# Patient Record
Sex: Female | Born: 2000
Health system: Southern US, Community
[De-identification: ages and names within clinical notes are randomized; demographics above are authoritative.]

## PROBLEM LIST (undated history)

## (undated) DIAGNOSIS — D352 Benign neoplasm of pituitary gland: Secondary | ICD-10-CM

---
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
## 2018-05-27 DIAGNOSIS — J3089 Other allergic rhinitis: Secondary | ICD-10-CM | POA: Diagnosis not present

## 2018-05-27 DIAGNOSIS — L509 Urticaria, unspecified: Secondary | ICD-10-CM | POA: Diagnosis not present

## 2018-05-27 DIAGNOSIS — K219 Gastro-esophageal reflux disease without esophagitis: Secondary | ICD-10-CM | POA: Diagnosis not present

## 2019-08-14 DIAGNOSIS — Z79899 Other long term (current) drug therapy: Secondary | ICD-10-CM | POA: Diagnosis not present

## 2019-08-14 DIAGNOSIS — F902 Attention-deficit hyperactivity disorder, combined type: Secondary | ICD-10-CM | POA: Diagnosis not present

## 2019-08-14 DIAGNOSIS — F419 Anxiety disorder, unspecified: Secondary | ICD-10-CM | POA: Diagnosis not present

## 2019-09-20 ENCOUNTER — Other Ambulatory Visit (HOSPITAL_COMMUNITY)
Admission: RE | Admit: 2019-09-20 | Discharge: 2019-09-20 | Disposition: A | Discharge status: Home / Self Care | Payer: BC Managed Care – PPO | Source: Ambulatory Visit | Attending: Family Medicine | Admitting: Family Medicine

## 2019-09-20 ENCOUNTER — Other Ambulatory Visit: Payer: Self-pay | Admitting: Family Medicine

## 2019-09-20 DIAGNOSIS — Z23 Encounter for immunization: Secondary | ICD-10-CM | POA: Diagnosis not present

## 2019-09-20 DIAGNOSIS — N926 Irregular menstruation, unspecified: Secondary | ICD-10-CM | POA: Diagnosis not present

## 2019-09-20 DIAGNOSIS — R5383 Other fatigue: Secondary | ICD-10-CM | POA: Diagnosis not present

## 2019-09-20 DIAGNOSIS — Z124 Encounter for screening for malignant neoplasm of cervix: Secondary | ICD-10-CM | POA: Diagnosis not present

## 2019-09-20 DIAGNOSIS — Z0000 Encounter for general adult medical examination without abnormal findings: Secondary | ICD-10-CM | POA: Diagnosis not present

## 2019-09-20 DIAGNOSIS — K219 Gastro-esophageal reflux disease without esophagitis: Secondary | ICD-10-CM | POA: Diagnosis not present

## 2019-09-20 DIAGNOSIS — Z13220 Encounter for screening for lipoid disorders: Secondary | ICD-10-CM | POA: Diagnosis not present

## 2019-09-20 DIAGNOSIS — Z79899 Other long term (current) drug therapy: Secondary | ICD-10-CM | POA: Diagnosis not present

## 2019-09-22 LAB — CYTOLOGY - PAP
Adequacy: ABSENT
Diagnosis: NEGATIVE

## 2019-11-09 DIAGNOSIS — F902 Attention-deficit hyperactivity disorder, combined type: Secondary | ICD-10-CM | POA: Diagnosis not present

## 2019-11-09 DIAGNOSIS — Z79899 Other long term (current) drug therapy: Secondary | ICD-10-CM | POA: Diagnosis not present

## 2019-11-09 DIAGNOSIS — F419 Anxiety disorder, unspecified: Secondary | ICD-10-CM | POA: Diagnosis not present

## 2019-12-01 DIAGNOSIS — Z23 Encounter for immunization: Secondary | ICD-10-CM | POA: Diagnosis not present

## 2020-02-07 DIAGNOSIS — F902 Attention-deficit hyperactivity disorder, combined type: Secondary | ICD-10-CM | POA: Diagnosis not present

## 2020-02-07 DIAGNOSIS — Z79899 Other long term (current) drug therapy: Secondary | ICD-10-CM | POA: Diagnosis not present

## 2020-02-12 ENCOUNTER — Other Ambulatory Visit: Payer: Self-pay | Admitting: Internal Medicine

## 2020-02-12 DIAGNOSIS — Z842 Family history of other diseases of the genitourinary system: Secondary | ICD-10-CM | POA: Diagnosis not present

## 2020-02-12 DIAGNOSIS — N946 Dysmenorrhea, unspecified: Secondary | ICD-10-CM

## 2020-02-12 DIAGNOSIS — N920 Excessive and frequent menstruation with regular cycle: Secondary | ICD-10-CM | POA: Diagnosis not present

## 2020-02-12 DIAGNOSIS — E349 Endocrine disorder, unspecified: Secondary | ICD-10-CM | POA: Diagnosis not present

## 2020-02-13 DIAGNOSIS — N920 Excessive and frequent menstruation with regular cycle: Secondary | ICD-10-CM | POA: Diagnosis not present

## 2020-02-13 DIAGNOSIS — E349 Endocrine disorder, unspecified: Secondary | ICD-10-CM | POA: Diagnosis not present

## 2020-02-13 DIAGNOSIS — Z842 Family history of other diseases of the genitourinary system: Secondary | ICD-10-CM | POA: Diagnosis not present

## 2020-03-01 ENCOUNTER — Ambulatory Visit
Admission: RE | Admit: 2020-03-01 | Discharge: 2020-03-01 | Disposition: A | Discharge status: Home / Self Care | Payer: BC Managed Care – PPO | Source: Ambulatory Visit | Attending: Internal Medicine | Admitting: Internal Medicine

## 2020-03-01 DIAGNOSIS — N946 Dysmenorrhea, unspecified: Secondary | ICD-10-CM

## 2020-03-01 DIAGNOSIS — N939 Abnormal uterine and vaginal bleeding, unspecified: Secondary | ICD-10-CM | POA: Diagnosis not present

## 2020-03-06 DIAGNOSIS — N920 Excessive and frequent menstruation with regular cycle: Secondary | ICD-10-CM | POA: Diagnosis not present

## 2020-03-06 DIAGNOSIS — Z842 Family history of other diseases of the genitourinary system: Secondary | ICD-10-CM | POA: Diagnosis not present

## 2020-03-06 DIAGNOSIS — E221 Hyperprolactinemia: Secondary | ICD-10-CM | POA: Diagnosis not present

## 2020-03-13 DIAGNOSIS — Z1159 Encounter for screening for other viral diseases: Secondary | ICD-10-CM | POA: Diagnosis not present

## 2020-03-15 ENCOUNTER — Other Ambulatory Visit: Payer: Self-pay | Admitting: Internal Medicine

## 2020-03-15 DIAGNOSIS — E221 Hyperprolactinemia: Secondary | ICD-10-CM

## 2020-04-09 DIAGNOSIS — E221 Hyperprolactinemia: Secondary | ICD-10-CM | POA: Diagnosis not present

## 2020-04-09 DIAGNOSIS — Z842 Family history of other diseases of the genitourinary system: Secondary | ICD-10-CM | POA: Diagnosis not present

## 2020-04-09 DIAGNOSIS — N920 Excessive and frequent menstruation with regular cycle: Secondary | ICD-10-CM | POA: Diagnosis not present

## 2020-04-10 ENCOUNTER — Ambulatory Visit
Admission: RE | Admit: 2020-04-10 | Discharge: 2020-04-10 | Disposition: A | Discharge status: Home / Self Care | Payer: BC Managed Care – PPO | Source: Ambulatory Visit | Attending: Internal Medicine | Admitting: Internal Medicine

## 2020-04-10 ENCOUNTER — Other Ambulatory Visit: Payer: Self-pay

## 2020-04-10 DIAGNOSIS — E221 Hyperprolactinemia: Secondary | ICD-10-CM

## 2020-04-10 DIAGNOSIS — E237 Disorder of pituitary gland, unspecified: Secondary | ICD-10-CM | POA: Diagnosis not present

## 2020-04-10 MED ORDER — GADOBENATE DIMEGLUMINE 529 MG/ML IV SOLN
8.0000 mL | Freq: Once | INTRAVENOUS | Status: AC | PRN
  Administered 2020-04-10: 8 mL via INTRAVENOUS

## 2020-04-19 DIAGNOSIS — M5441 Lumbago with sciatica, right side: Secondary | ICD-10-CM | POA: Diagnosis not present

## 2020-04-22 DIAGNOSIS — Z842 Family history of other diseases of the genitourinary system: Secondary | ICD-10-CM | POA: Diagnosis not present

## 2020-04-22 DIAGNOSIS — E221 Hyperprolactinemia: Secondary | ICD-10-CM | POA: Diagnosis not present

## 2020-04-22 DIAGNOSIS — N920 Excessive and frequent menstruation with regular cycle: Secondary | ICD-10-CM | POA: Diagnosis not present

## 2020-04-22 DIAGNOSIS — D352 Benign neoplasm of pituitary gland: Secondary | ICD-10-CM | POA: Diagnosis not present

## 2020-05-20 DIAGNOSIS — E221 Hyperprolactinemia: Secondary | ICD-10-CM | POA: Diagnosis not present

## 2020-05-29 DIAGNOSIS — J069 Acute upper respiratory infection, unspecified: Secondary | ICD-10-CM | POA: Diagnosis not present

## 2020-06-10 DIAGNOSIS — E221 Hyperprolactinemia: Secondary | ICD-10-CM | POA: Diagnosis not present

## 2020-06-10 DIAGNOSIS — D352 Benign neoplasm of pituitary gland: Secondary | ICD-10-CM | POA: Diagnosis not present

## 2020-06-10 DIAGNOSIS — Z842 Family history of other diseases of the genitourinary system: Secondary | ICD-10-CM | POA: Diagnosis not present

## 2020-06-14 DIAGNOSIS — Z79899 Other long term (current) drug therapy: Secondary | ICD-10-CM | POA: Diagnosis not present

## 2020-06-14 DIAGNOSIS — F902 Attention-deficit hyperactivity disorder, combined type: Secondary | ICD-10-CM | POA: Diagnosis not present

## 2020-08-28 DIAGNOSIS — H60312 Diffuse otitis externa, left ear: Secondary | ICD-10-CM | POA: Diagnosis not present

## 2020-09-14 ENCOUNTER — Emergency Department (HOSPITAL_COMMUNITY): Payer: BC Managed Care – PPO

## 2020-09-14 ENCOUNTER — Other Ambulatory Visit: Payer: Self-pay

## 2020-09-14 ENCOUNTER — Emergency Department (HOSPITAL_COMMUNITY)
Admission: EM | Admit: 2020-09-14 | Discharge: 2020-09-14 | Disposition: A | Discharge status: Home / Self Care | Payer: BC Managed Care – PPO | Attending: Emergency Medicine | Admitting: Emergency Medicine

## 2020-09-14 ENCOUNTER — Encounter (HOSPITAL_COMMUNITY): Payer: Self-pay | Admitting: Emergency Medicine

## 2020-09-14 DIAGNOSIS — H539 Unspecified visual disturbance: Secondary | ICD-10-CM | POA: Diagnosis not present

## 2020-09-14 DIAGNOSIS — R110 Nausea: Secondary | ICD-10-CM | POA: Diagnosis not present

## 2020-09-14 DIAGNOSIS — Z86018 Personal history of other benign neoplasm: Secondary | ICD-10-CM | POA: Insufficient documentation

## 2020-09-14 DIAGNOSIS — C719 Malignant neoplasm of brain, unspecified: Secondary | ICD-10-CM | POA: Diagnosis not present

## 2020-09-14 DIAGNOSIS — Q048 Other specified congenital malformations of brain: Secondary | ICD-10-CM | POA: Diagnosis not present

## 2020-09-14 HISTORY — DX: Benign neoplasm of pituitary gland: D35.2

## 2020-09-14 LAB — BASIC METABOLIC PANEL
Anion gap: 7 (ref 5–15)
BUN: 12 mg/dL (ref 6–20)
CO2: 24 mmol/L (ref 22–32)
Calcium: 9.5 mg/dL (ref 8.9–10.3)
Chloride: 107 mmol/L (ref 98–111)
Creatinine, Ser: 0.7 mg/dL (ref 0.44–1.00)
GFR, Estimated: >60 mL/min (ref >60)
Glucose, Bld: 94 mg/dL (ref 70–99)
Potassium: 4.3 mmol/L (ref 3.5–5.1)
Sodium: 138 mmol/L (ref 135–145)

## 2020-09-14 LAB — CBC
HCT: 40.1 % (ref 36.0–46.0)
Hemoglobin: 13.5 g/dL (ref 12.0–15.0)
MCH: 32.6 pg (ref 26.0–34.0)
MCHC: 33.7 g/dL (ref 30.0–36.0)
MCV: 96.9 fL (ref 80.0–100.0)
Platelets: 261 10*3/uL (ref 150–400)
RBC: 4.14 MIL/uL (ref 3.87–5.11)
RDW: 12.7 % (ref 11.5–15.5)
WBC: 5.9 10*3/uL (ref 4.0–10.5)
nRBC: 0 % (ref 0.0–0.2)

## 2020-09-14 MED ORDER — GADOBUTROL 1 MMOL/ML IV SOLN
7.0000 mL | Freq: Once | INTRAVENOUS | Status: AC | PRN
  Administered 2020-09-14: 7 mL via INTRAVENOUS

## 2020-09-14 NOTE — ED Notes (Signed)
Visual Acuity: 20/13 bilateral

## 2020-09-14 NOTE — Discharge Instructions (Addendum)
Follow-up with your eye doctor as we discussed.  Also consider following up with your primary care doctor for further evaluation of the symptoms persist.

## 2020-09-14 NOTE — ED Triage Notes (Signed)
Has had changes to her vision since this morning, sent via walk-in clinic for rule out of pituitary tumor/ retinal detachment. Pt reports nausea last night, reports occasional shadows / floaters, states when she changes position her vision gets blurry and 'tunnel vision'

## 2020-09-14 NOTE — ED Provider Notes (Signed)
[facility] DEPT Provider Note   CSN: [id number] Arrival date & time: 09/14/20  1519     History Chief complaint: Visual disturbance  Amber Moore is a 20 y.o. female.  HPI  Patient states she has noted some issues of changes in her vision that she describes as occasional shadows and floaters.  She also has some episodes of nausea and flashes of light in her eyes.  Patient cannot tell if it is 1 or both eyes.  She has noted when she changes her vision she seems to get blurry and tunnel vision.  Patient is not having any eye pain.  She does not have any visual defects right now.  She does have a history of a pituitary microadenoma.  Patient went to a walk-in clinic today and they thought she could have a retinal detachment or possibly worsening changes associated with her pituitary tumor.  She was sent to the ED for further evaluation.  Past Medical History:  Diagnosis Date   Pituitary adenoma ([place])     There are no problems to display for this patient.   History reviewed. No pertinent surgical history.   OB History   No obstetric history on file.     No family history on file.     Home Medications Prior to Admission medications   Not on File    Allergies    Patient has no known allergies.  Review of Systems   Review of Systems  Constitutional:  Negative for fever.  Eyes:  Positive for visual disturbance.  Neurological:  Negative for headaches.  All other systems reviewed and are negative.  Physical Exam Updated Vital Signs BP 115/72   Pulse 94   Temp 98.9 F (37.2 C) (Oral)   Resp 18   Ht 1.676 m ('5\' 6"'$ )   Wt 70.1 kg   LMP 08/28/2020 (Approximate)   SpO2 100%   BMI 24.95 kg/m   Physical Exam Vitals and nursing note reviewed.  Constitutional:      General: She is not in acute distress.    Appearance: She is well-developed.  HENT:     Head: Normocephalic and atraumatic.     Right Ear: External ear normal.     Left  Ear: External ear normal.  Eyes:     General: No scleral icterus.       Right eye: No discharge.        Left eye: No discharge.     Extraocular Movements: Extraocular movements intact.     Conjunctiva/sclera: Conjunctivae normal.     Pupils: Pupils are equal, round, and reactive to light. Pupils are equal.     Funduscopic exam:    Right eye: No papilledema.        Left eye: No papilledema.     Comments: No papilledema noted laterally  Neck:     Trachea: No tracheal deviation.  Cardiovascular:     Rate and Rhythm: Normal rate and regular rhythm.  Pulmonary:     Effort: Pulmonary effort is normal. No respiratory distress.     Breath sounds: Normal breath sounds. No stridor. No wheezing or rales.  Abdominal:     General: Bowel sounds are normal. There is no distension.     Palpations: Abdomen is soft.     Tenderness: There is no abdominal tenderness. There is no guarding or rebound.  Musculoskeletal:        General: No tenderness.     Cervical back: Neck supple.  Skin:  General: Skin is warm and dry.     Findings: No rash.  Neurological:     Mental Status: She is alert and oriented to person, place, and time.     Cranial Nerves: No cranial nerve deficit (No facial droop, extraocular movements intact, tongue midline ).     Sensory: No sensory deficit.     Motor: No abnormal muscle tone or seizure activity.     Coordination: Coordination normal.     Comments: No pronator drift bilateral upper extrem, able to hold both legs off bed for 5 seconds, sensation intact in all extremities, no visual field cuts, no left or right sided neglect, normal finger-nose exam bilaterally, no nystagmus noted     ED Results / Procedures / Treatments   Labs (all labs ordered are listed, but only abnormal results are displayed) Labs Reviewed  CBC  BASIC METABOLIC PANEL    EKG None  Radiology MR Brain W and Wo Contrast  Result Date: 09/14/2020 CLINICAL DATA:  Brain/CNS neoplasm, assess  treatment response hx pituitary adenoma, recent visual changes. EXAM: MRI HEAD WITHOUT AND WITH CONTRAST TECHNIQUE: Multiplanar, multiecho pulse sequences of the brain and surrounding structures were obtained without and with intravenous contrast. CONTRAST:  9m GADAVIST GADOBUTROL 1 MMOL/ML IV SOLN COMPARISON:  04/10/2020 FINDINGS: Brain: There is no evidence of an acute infarct, intracranial hemorrhage, midline shift, or extra-axial fluid collection. Mild cerebellar tonsillar ectopia measures 5 mm, unchanged and without other findings of Chiari malformation. The ventricles are normal in size. Outside of the pituitary, the brain is normal in signal and no abnormal enhancement is identified. Dedicated pituitary imaging was not performed today. However, the pituitary gland is within normal limits in size and is also unchanged in size from the prior MRI. Lack of thin-section dedicated pituitary imaging limits reassessment of the questioned microadenoma on the prior study, however no sellar or suprasellar mass is present which would cause a visual disturbance, and the optic chiasm is normal in appearance. Vascular: Major intracranial vascular flow voids are preserved. Skull and upper cervical spine: Unremarkable bone marrow signal. Sinuses/Orbits: Unremarkable orbits. Clear paranasal sinuses. Small right mastoid effusion. Other: None. IMPRESSION: 1. Unchanged appearance of the brain. No acute intracranial abnormality. 2. Dedicated pituitary imaging was not performed, however there is no sellar or suprasellar mass which would cause visual disturbance. Electronically Signed   By: ALogan BoresM.D.   On: 09/14/2020 19:10    Procedures Procedures   Medications Ordered in ED Medications  gadobutrol (GADAVIST) 1 MMOL/ML injection 7 mL (7 mLs Intravenous Contrast Given 09/14/20 1808)    ED Course  I have reviewed the triage vital signs and the nursing notes.  Pertinent labs & imaging results that were available  during my care of the patient were reviewed by me and considered in my medical decision making (see chart for details).  Clinical Course as of 09/14/20 1938  Sat Sep 14, 2020  1918 MRI without acute changes noted in the brain [JK]    Clinical Course User Index [JK] KDorie Rank MD   MDM Rules/Calculators/A&P                          Doubt retinal detachment as her symptoms are bilateral and not persistent.  Some component almost sounds as possible orthostasis or migraine aura.  With her pituitary tumor we will proceed with MRI to evaluate for any acute changes  Patient's laboratory tests are unremarkable.  No signs  of anemia.  No dehydration.  MRI does not show any acute changes.  Possible symptoms could be related to migraine aura.  She is not have any symptoms at this time.  Recommend outpatient follow-up with PCP and her eye doctor. Final Clinical Impression(s) / ED Diagnoses Final diagnoses:  Visual disturbance    Rx / DC Orders ED Discharge Orders     None        Dorie Rank, MD 09/14/20 [phone]

## 2020-09-16 DIAGNOSIS — G43109 Migraine with aura, not intractable, without status migrainosus: Secondary | ICD-10-CM | POA: Diagnosis not present

## 2020-10-09 DIAGNOSIS — Z13220 Encounter for screening for lipoid disorders: Secondary | ICD-10-CM | POA: Diagnosis not present

## 2020-10-09 DIAGNOSIS — Z23 Encounter for immunization: Secondary | ICD-10-CM | POA: Diagnosis not present

## 2020-10-09 DIAGNOSIS — Z0000 Encounter for general adult medical examination without abnormal findings: Secondary | ICD-10-CM | POA: Diagnosis not present

## 2020-10-15 DIAGNOSIS — F902 Attention-deficit hyperactivity disorder, combined type: Secondary | ICD-10-CM | POA: Diagnosis not present

## 2020-10-15 DIAGNOSIS — Z79899 Other long term (current) drug therapy: Secondary | ICD-10-CM | POA: Diagnosis not present

## 2020-11-20 DIAGNOSIS — Z23 Encounter for immunization: Secondary | ICD-10-CM | POA: Diagnosis not present

## 2020-12-17 DIAGNOSIS — Z23 Encounter for immunization: Secondary | ICD-10-CM | POA: Diagnosis not present

## 2021-01-14 DIAGNOSIS — Z79899 Other long term (current) drug therapy: Secondary | ICD-10-CM | POA: Diagnosis not present

## 2021-01-14 DIAGNOSIS — F902 Attention-deficit hyperactivity disorder, combined type: Secondary | ICD-10-CM | POA: Diagnosis not present

## 2021-04-11 DIAGNOSIS — Z23 Encounter for immunization: Secondary | ICD-10-CM | POA: Diagnosis not present

## 2021-04-14 DIAGNOSIS — E221 Hyperprolactinemia: Secondary | ICD-10-CM | POA: Diagnosis not present

## 2021-04-14 DIAGNOSIS — Z842 Family history of other diseases of the genitourinary system: Secondary | ICD-10-CM | POA: Diagnosis not present

## 2021-04-14 DIAGNOSIS — D352 Benign neoplasm of pituitary gland: Secondary | ICD-10-CM | POA: Diagnosis not present

## 2021-04-16 DIAGNOSIS — Z79899 Other long term (current) drug therapy: Secondary | ICD-10-CM | POA: Diagnosis not present

## 2021-04-16 DIAGNOSIS — F902 Attention-deficit hyperactivity disorder, combined type: Secondary | ICD-10-CM | POA: Diagnosis not present

## 2021-04-17 DIAGNOSIS — F902 Attention-deficit hyperactivity disorder, combined type: Secondary | ICD-10-CM | POA: Diagnosis not present

## 2021-04-30 DIAGNOSIS — F902 Attention-deficit hyperactivity disorder, combined type: Secondary | ICD-10-CM | POA: Diagnosis not present

## 2021-04-30 DIAGNOSIS — F411 Generalized anxiety disorder: Secondary | ICD-10-CM | POA: Diagnosis not present

## 2021-05-12 DIAGNOSIS — E221 Hyperprolactinemia: Secondary | ICD-10-CM | POA: Diagnosis not present

## 2021-05-13 DIAGNOSIS — F902 Attention-deficit hyperactivity disorder, combined type: Secondary | ICD-10-CM | POA: Diagnosis not present

## 2021-05-13 DIAGNOSIS — F411 Generalized anxiety disorder: Secondary | ICD-10-CM | POA: Diagnosis not present

## 2021-06-03 DIAGNOSIS — F902 Attention-deficit hyperactivity disorder, combined type: Secondary | ICD-10-CM | POA: Diagnosis not present

## 2021-06-03 DIAGNOSIS — F411 Generalized anxiety disorder: Secondary | ICD-10-CM | POA: Diagnosis not present

## 2021-06-13 DIAGNOSIS — F411 Generalized anxiety disorder: Secondary | ICD-10-CM | POA: Diagnosis not present

## 2021-06-13 DIAGNOSIS — F902 Attention-deficit hyperactivity disorder, combined type: Secondary | ICD-10-CM | POA: Diagnosis not present

## 2021-06-20 DIAGNOSIS — E221 Hyperprolactinemia: Secondary | ICD-10-CM | POA: Diagnosis not present

## 2021-06-25 DIAGNOSIS — F411 Generalized anxiety disorder: Secondary | ICD-10-CM | POA: Diagnosis not present

## 2021-06-25 DIAGNOSIS — F902 Attention-deficit hyperactivity disorder, combined type: Secondary | ICD-10-CM | POA: Diagnosis not present

## 2021-07-09 DIAGNOSIS — F902 Attention-deficit hyperactivity disorder, combined type: Secondary | ICD-10-CM | POA: Diagnosis not present

## 2021-07-09 DIAGNOSIS — F411 Generalized anxiety disorder: Secondary | ICD-10-CM | POA: Diagnosis not present

## 2021-07-23 DIAGNOSIS — F902 Attention-deficit hyperactivity disorder, combined type: Secondary | ICD-10-CM | POA: Diagnosis not present

## 2021-07-23 DIAGNOSIS — F411 Generalized anxiety disorder: Secondary | ICD-10-CM | POA: Diagnosis not present

## 2021-07-28 DIAGNOSIS — H60501 Unspecified acute noninfective otitis externa, right ear: Secondary | ICD-10-CM | POA: Diagnosis not present

## 2021-07-30 DIAGNOSIS — F900 Attention-deficit hyperactivity disorder, predominantly inattentive type: Secondary | ICD-10-CM | POA: Diagnosis not present

## 2021-07-30 DIAGNOSIS — F419 Anxiety disorder, unspecified: Secondary | ICD-10-CM | POA: Diagnosis not present

## 2021-08-12 DIAGNOSIS — F902 Attention-deficit hyperactivity disorder, combined type: Secondary | ICD-10-CM | POA: Diagnosis not present

## 2021-08-12 DIAGNOSIS — F411 Generalized anxiety disorder: Secondary | ICD-10-CM | POA: Diagnosis not present

## 2021-09-03 DIAGNOSIS — H00014 Hordeolum externum left upper eyelid: Secondary | ICD-10-CM | POA: Diagnosis not present

## 2021-09-30 DIAGNOSIS — Z0000 Encounter for general adult medical examination without abnormal findings: Secondary | ICD-10-CM | POA: Diagnosis not present

## 2021-09-30 DIAGNOSIS — Z13220 Encounter for screening for lipoid disorders: Secondary | ICD-10-CM | POA: Diagnosis not present

## 2021-10-06 DIAGNOSIS — Z01419 Encounter for gynecological examination (general) (routine) without abnormal findings: Secondary | ICD-10-CM | POA: Diagnosis not present

## 2021-10-06 DIAGNOSIS — Z3202 Encounter for pregnancy test, result negative: Secondary | ICD-10-CM | POA: Diagnosis not present

## 2021-10-06 DIAGNOSIS — Z6825 Body mass index (BMI) 25.0-25.9, adult: Secondary | ICD-10-CM | POA: Diagnosis not present

## 2021-10-06 DIAGNOSIS — Z124 Encounter for screening for malignant neoplasm of cervix: Secondary | ICD-10-CM | POA: Diagnosis not present

## 2021-12-03 DIAGNOSIS — F900 Attention-deficit hyperactivity disorder, predominantly inattentive type: Secondary | ICD-10-CM | POA: Diagnosis not present

## 2021-12-03 DIAGNOSIS — F419 Anxiety disorder, unspecified: Secondary | ICD-10-CM | POA: Diagnosis not present

## 2022-12-13 IMAGING — MR MR HEAD WO/W CM
15 series · 48 of 48 positions shown · IV contrast (gadavist)
Comparison: 04/10/2020

CLINICAL DATA: Brain/CNS neoplasm, assess treatment response hx
pituitary adenoma, recent visual changes.

EXAM:
MRI HEAD WITHOUT AND WITH CONTRAST
TECHNIQUE: Multiplanar, multiecho pulse sequences of the brain and surrounding
structures were obtained without and with intravenous contrast.
CONTRAST:  7mL GADAVIST GADOBUTROL 1 MMOL/ML IV SOLN

[Series 5: DWI · axial · 3.0mm · 1.36mm/px · z∈[-66,+89]mm · 5 of 108 slices shown (1 of 2)]
[im 1/108]
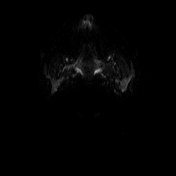
[im 27/108]
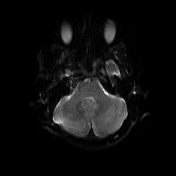
[im 54/108]
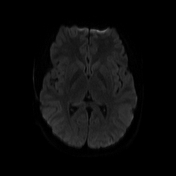
[im 81/108]
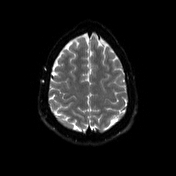
[im 108/108]
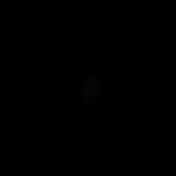

[Series 6: DWI · axial · 3.0mm · 1.36mm/px · z∈[-66,+89]mm · 3 of 54 slices shown (2 of 2)]
[im 1/54]
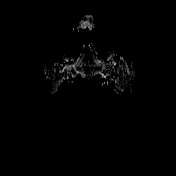
[im 27/54]
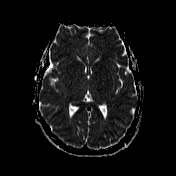
[im 54/54]
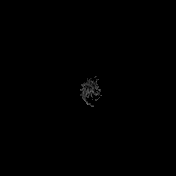

[Series 7: T1 · sagittal · 5.0mm · 0.75mm/px · 1 of 26 slices shown (1 of 4)]
[im 1/26]
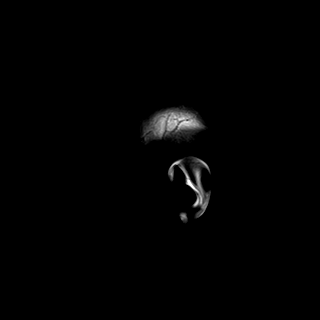

[Series 8: T2 · axial · 5.0mm · 0.62mm/px · 1 of 26 slices shown]
[im 1/26]
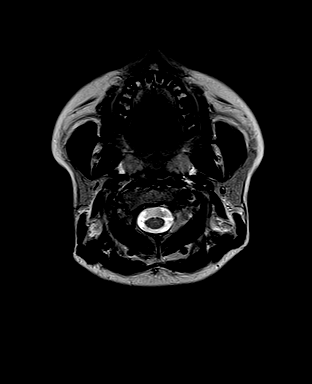

[Series 9: swi_images · axial · 3.0mm · 0.75mm/px · z∈[-71,+90]mm · 3 of 56 slices shown]
[im 1/56]
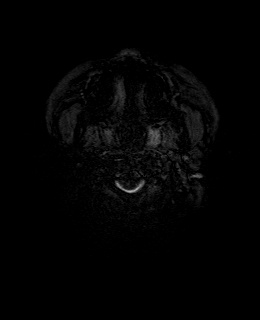
[im 28/56]
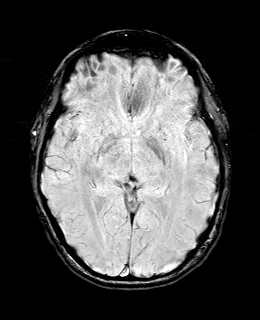
[im 56/56]
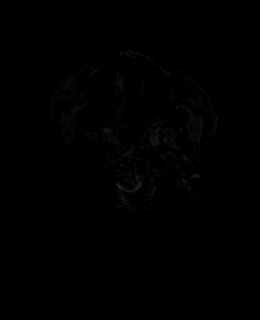

[Series 11: FLAIR · axial · 3.0mm · 0.75mm/px · z∈[-69,+89]mm · 3 of 55 slices shown]
[im 1/55]
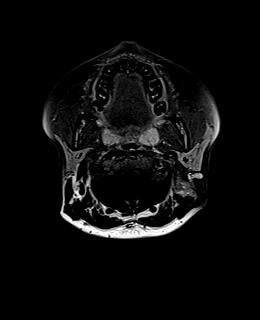
[im 28/55]
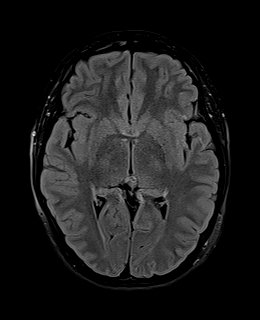
[im 55/55]
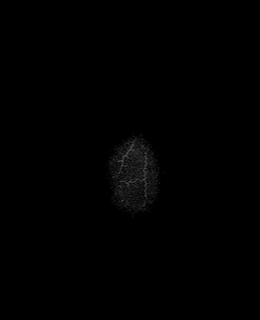

[Series 12: T1 · axial · 1.0mm · 0.94mm/px · z∈[-59,+95]mm · 9 of 160 slices shown (2 of 4)]
[im 1/160]
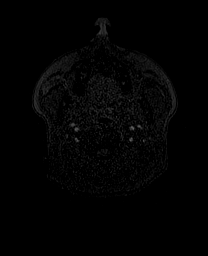
[im 20/160]
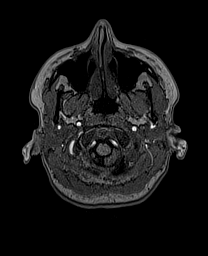
[im 40/160]
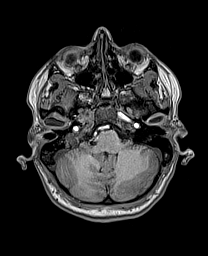
[im 60/160]
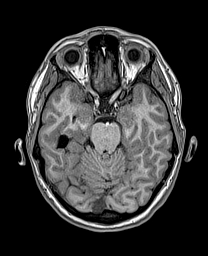
[im 80/160]
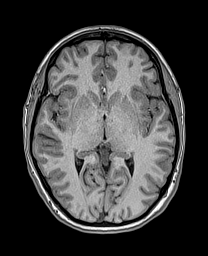
[im 100/160]
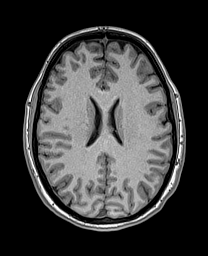
[im 120/160]
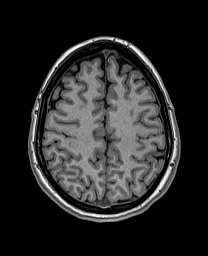
[im 140/160]
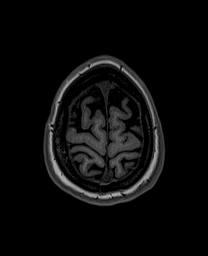
[im 160/160]
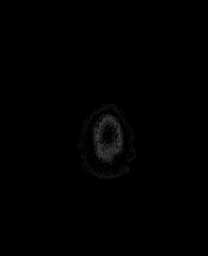

[Series 13: cor dwi_tracew · coronal · 5.0mm · 1.53mm/px · 3 of 60 slices shown]
[im 1/60]
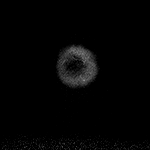
[im 30/60]
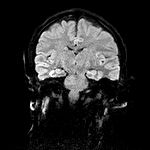
[im 60/60]
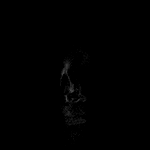

[Series 14: cor dwi_adc · coronal · 5.0mm · 1.53mm/px · 2 of 29 slices shown]
[im 1/29]
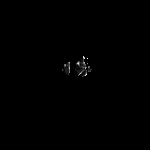
[im 29/29]
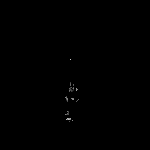

[Series 15: T2 post-contrast · coronal · 5.0mm · 0.57mm/px · 2 of 30 slices shown]
[im 1/30]
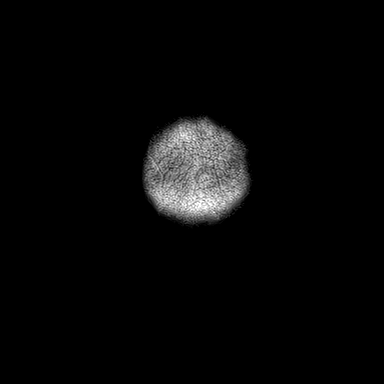
[im 30/30]
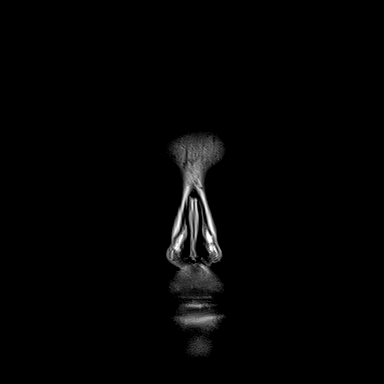

[Series 16: T1 post-contrast · axial · 1.0mm · 0.94mm/px · z∈[-59,+95]mm · 9 of 160 slices shown (1 of 3)]
[im 1/160]
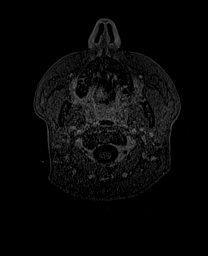
[im 20/160]
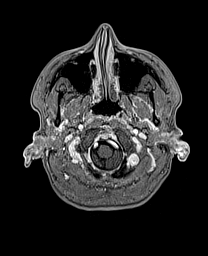
[im 40/160]
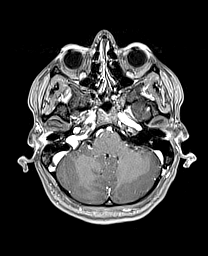
[im 60/160]
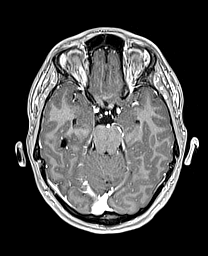
[im 80/160]
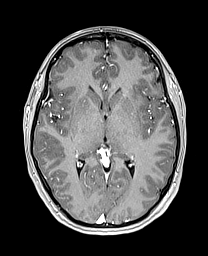
[im 100/160]
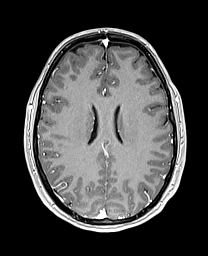
[im 120/160]
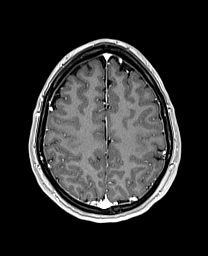
[im 140/160]
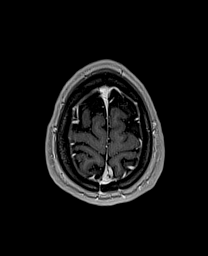
[im 160/160]
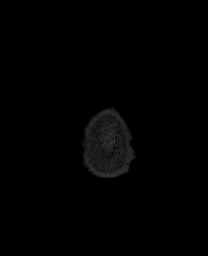

[Series 17: T1 · sagittal · 4.0mm · 0.94mm/px · 2 of 35 slices shown (3 of 4)]
[im 1/35]
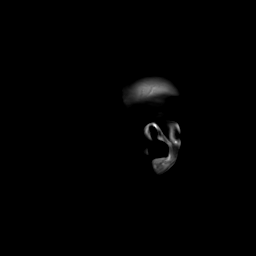
[im 35/35]
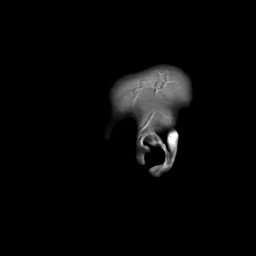

[Series 18: T1 · coronal · 4.0mm · 0.94mm/px · 2 of 45 slices shown (4 of 4)]
[im 1/45]
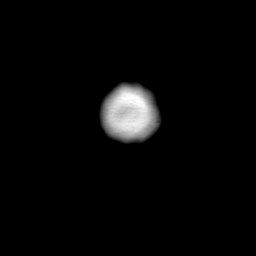
[im 45/45]
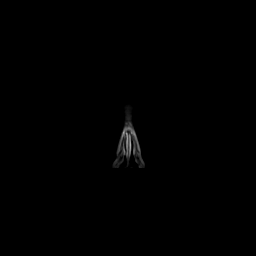

[Series 19: T1 post-contrast · coronal · 5.0mm · 0.43mm/px · 2 of 30 slices shown (2 of 3)]
[im 1/30]
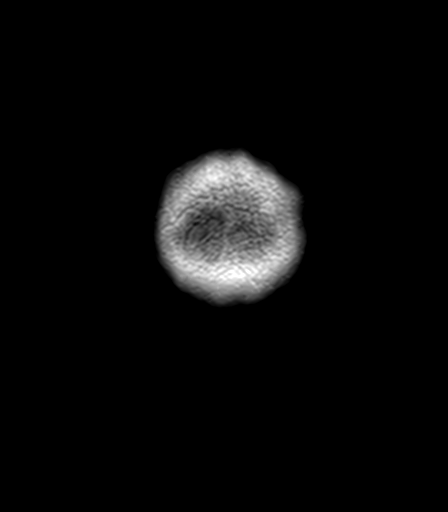
[im 30/30]
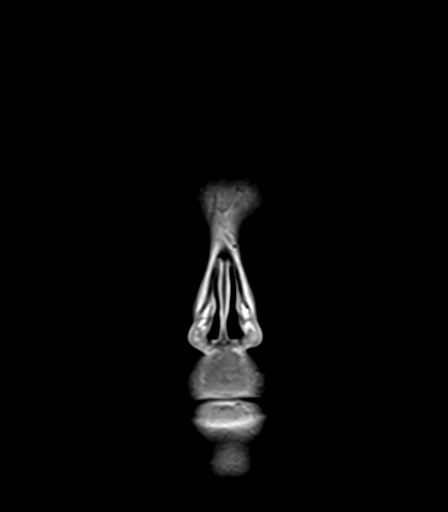

[Series 20: T1 post-contrast · sagittal · 5.0mm · 0.75mm/px · 1 of 26 slices shown (3 of 3)]
[im 1/26]
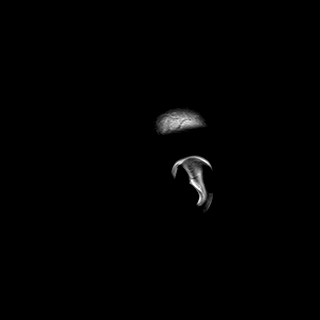

[48 of 48 positions shown; findings below may reference images not displayed]

FINDINGS: Brain: There is no evidence of an acute infarct, intracranial
hemorrhage, midline shift, or extra-axial fluid collection. Mild
cerebellar tonsillar ectopia measures 5 mm, unchanged and without
other findings of Chiari malformation. The ventricles are normal in
size. Outside of the pituitary, the brain is normal in signal and no
abnormal enhancement is identified.

Dedicated pituitary imaging was not performed today. However, the
pituitary gland is within normal limits in size and is also
unchanged in size from the prior MRI. Lack of thin-section dedicated
pituitary imaging limits reassessment of the questioned microadenoma
on the prior study, however no sellar or suprasellar mass is present
which would cause a visual disturbance, and the optic chiasm is
normal in appearance.

Vascular: Major intracranial vascular flow voids are preserved.

Skull and upper cervical spine: Unremarkable bone marrow signal.

Sinuses/Orbits: Unremarkable orbits. Clear paranasal sinuses. Small
right mastoid effusion.

Other: None.
IMPRESSION: 1. Unchanged appearance of the brain. No acute intracranial
abnormality.
2. Dedicated pituitary imaging was not performed, however there is
no sellar or suprasellar mass which would cause visual disturbance.
